# Patient Record
Sex: Female | Born: 1990 | Hispanic: Yes | Marital: Married
Health system: Midwestern US, Community
[De-identification: ages and names within clinical notes are randomized; demographics above are authoritative.]

## PROBLEM LIST (undated history)

## (undated) DIAGNOSIS — Z0000 Encounter for general adult medical examination without abnormal findings: Secondary | ICD-10-CM

## (undated) DIAGNOSIS — E282 Polycystic ovarian syndrome: Secondary | ICD-10-CM

## (undated) DIAGNOSIS — G43909 Migraine, unspecified, not intractable, without status migrainosus: Secondary | ICD-10-CM

## (undated) HISTORY — PX: EYE SURGERY: SHX253

---

## 2018-07-17 ENCOUNTER — Ambulatory Visit: Payer: No Typology Code available for payment source | Admitting: Family Medicine

## 2018-07-17 ENCOUNTER — Other Ambulatory Visit: Payer: Self-pay

## 2018-07-17 ENCOUNTER — Encounter: Payer: Self-pay | Admitting: Family Medicine

## 2018-07-17 VITALS — BP 122/76 | HR 73 | Temp 98.0°F | Resp 14 | Ht 66.0 in | Wt 157.2 lb

## 2018-07-17 DIAGNOSIS — N926 Irregular menstruation, unspecified: Secondary | ICD-10-CM

## 2018-07-17 DIAGNOSIS — Z3045 Encounter for surveillance of transdermal patch hormonal contraceptive device: Secondary | ICD-10-CM

## 2018-07-17 DIAGNOSIS — R1030 Lower abdominal pain, unspecified: Secondary | ICD-10-CM | POA: Diagnosis not present

## 2018-07-17 LAB — POCT URINE PREGNANCY: Preg Test, Ur: NEGATIVE

## 2018-07-17 MED ORDER — NORELGESTROMIN-ETH ESTRADIOL 150-35 MCG/24HR TD PTWK: 1.0000 | MEDICATED_PATCH | TRANSDERMAL | 5 refills | Status: DC

## 2018-07-17 MED ORDER — AMITRIPTYLINE HCL 25 MG PO TABS: 25.0000 mg | ORAL_TABLET | Freq: Every day | ORAL | 2 refills | Status: DC

## 2018-07-17 NOTE — Patient Instructions (Signed)
° ° ° °  If you have lab work done today you will be contacted with your lab results within the next 2 weeks.  If you have not heard from us then please contact us. The fastest way to get your results is to register for My Chart. ° ° °IF you received an x-ray today, you will receive an invoice from [facility]. Please contact [facility] at [phone] with questions or concerns regarding your invoice.  ° °IF you received labwork today, you will receive an invoice from [facility]. Please contact [facility] at [phone] with questions or concerns regarding your invoice.  ° °Our billing staff will not be able to assist you with questions regarding bills from these companies. ° °You will be contacted with the lab results as soon as they are available. The fastest way to get your results is to activate your My Chart account. Instructions are located on the last page of this paperwork. If you have not heard from us regarding the results in 2 weeks, please contact this office. °  ° ° ° °

## 2018-07-17 NOTE — Progress Notes (Signed)
2/8/20208:41 AM  Vicki Alvarez July 03, 1990, 29 y.o. female [id number]  Chief Complaint  Patient presents with  . Establish Care    just moved here and need to establish care, need a refill on birthcontrol patch (IC xulane 150/35) was put on [facility] due to very bad cramp, missed periods. Has been having periods since been on bc patch but still having cramping    HPI:   Patient is a 28 y.o. female who presents today to establish care. Patient requesting refill of her birth control.  Patient recently moved here from [place], looking for warmer weather Recently married this Nov Has h/o irregular periods and bad cramps, heavy but denies clots Reports normal [place] Was gaining weight and several BC Being on depo and COC still having pain Was placed on the patch, periods regular but still painful Cramping last entire menses which last about a week Putting pressure helps She denies any changes to bowels First severe abd pain started when she started having menstrual migraines A/w bloating and nausea G0 No interruption in [facility] Last pap 2019 - normal   Fall Risk  07/17/2018  Falls in the past year? 0  Number falls in past yr: 0  Injury with Fall? 0  Follow up Falls evaluation completed     Depression screen [facility] 2/9 07/17/2018  Decreased Interest 0  Down, Depressed, Hopeless 0  PHQ - 2 Score 0    No Known Allergies  Prior to Admission medications   Medication Sig Start Date End Date Taking? Authorizing Provider  ibuprofen (ADVIL,MOTRIN) 800 MG tablet Take 800 mg by mouth every 8 (eight) hours as needed.   Yes [provider]  norelgestromin-ethinyl estradiol [facility] Medico) 150-35 MCG/24HR transdermal patch Place 1 patch onto the skin once a week.   Yes [provider]    No past medical history on file.  History reviewed. No pertinent surgical history.  Social History   Tobacco Use  . Smoking status: Not on file  Substance Use Topics  . Alcohol use: Not on file    No  family history on file.  Review of Systems  Constitutional: Negative for chills, fever, malaise/fatigue and weight loss.  Respiratory: Negative for cough and shortness of breath.   Cardiovascular: Negative for chest pain, palpitations and leg swelling.  Gastrointestinal: Positive for abdominal pain and nausea. Negative for blood in stool, constipation, diarrhea (but patient reports  baseline BM about 3 x day), melena and vomiting.  Neurological: Positive for headaches. Negative for dizziness.     OBJECTIVE:  Blood pressure 122/76, pulse 73, temperature 98 F (36.7 C), temperature source Oral, resp. rate 14, height 5\' 6"  (1.676 m), weight 157 lb 3.2 oz (71.3 kg), SpO2 98 %. Body mass index is 25.37 kg/m.   Physical Exam Vitals signs and nursing note reviewed.  Constitutional:      Appearance: She is well-developed.  HENT:     Head: Normocephalic and atraumatic.     Mouth/Throat:     Pharynx: No oropharyngeal exudate.  Eyes:     General: No scleral icterus.    Conjunctiva/sclera: Conjunctivae normal.     Pupils: Pupils are equal, round, and reactive to light.  Neck:     Musculoskeletal: Neck supple.  Cardiovascular:     Rate and Rhythm: Normal rate and regular rhythm.     Heart sounds: Normal heart sounds. No murmur. No friction rub. No gallop.   Pulmonary:     Effort: Pulmonary effort is normal.  Breath sounds: Normal breath sounds. No wheezing or rales.  Abdominal:     General: Bowel sounds are normal.     Palpations: Abdomen is soft.     Tenderness: There is abdominal tenderness in the suprapubic area and left lower quadrant. There is rebound. There is no guarding.  Skin:    General: Skin is warm and dry.  Neurological:     Mental Status: She is alert and oriented to person, place, and time.     Results for orders placed or performed in visit on 07/17/18 (from the past 24 hour(s))  POCT urine pregnancy     Status: None   Collection Time: 07/17/18  8:49 AM    Result Value Ref Range   Preg Test, Ur Negative Negative    ASSESSMENT and PLAN  1. Encounter for surveillance of transdermal patch hormonal contraceptive device Stable. Will cont with current BC method - POCT urine pregnancy  2. Irregular periods - CBC - TSH  3. Lower abdominal pain ? abd migraines? Trial of TCA. Reviewed r/se/b   Other orders - ibuprofen (ADVIL,MOTRIN) 800 MG tablet; Take 800 mg by mouth every 8 (eight) hours as needed. - amitriptyline (ELAVIL) 25 MG tablet; Take 1 tablet (25 mg total) by mouth at bedtime. - norelgestromin-ethinyl estradiol [facility]ico(XULANE) 150-35 MCG/24HR transdermal patch; Place 1 patch onto the skin once a week.  Return in about 3 months (around 10/15/2018).    Myles LippsIrma M Santiago, MD [facility] [address] [place], [place] [address] Ph.  [phone] Fax [phone]

## 2018-07-18 LAB — CBC
Hematocrit: 40.2 % (ref 34.0–46.6)
Hemoglobin: 13.2 g/dL (ref 11.1–15.9)
MCH: 29.1 pg (ref 26.6–33.0)
MCHC: 32.8 g/dL (ref 31.5–35.7)
MCV: 89 fL (ref 79–97)
Platelets: 297 10*3/uL (ref 150–450)
RBC: 4.54 x10E6/uL (ref 3.77–5.28)
RDW: 12.2 % (ref 11.7–15.4)
WBC: 7.3 10*3/uL (ref 3.4–10.8)

## 2018-07-18 LAB — TSH: TSH: 0.717 u[IU]/mL (ref 0.450–4.500)

## 2018-07-22 ENCOUNTER — Encounter: Payer: Self-pay | Admitting: Family Medicine

## 2018-07-28 ENCOUNTER — Telehealth: Payer: Self-pay | Admitting: Family Medicine

## 2018-07-28 NOTE — Telephone Encounter (Signed)
Copied from CRM [id number]. Topic: Quick Communication - See Telephone Encounter >> Jul 28, 2018 11:41 AM Lorrine Kin, NT wrote: CRM for notification. See Telephone encounter for: 07/28/18.  See MyChart message from 07/22/2018.  Patient calling and states that the medication that was prescribed makes her nauseated and very slow. 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 Would like to know if there is an alternative?

## 2018-07-28 NOTE — Telephone Encounter (Signed)
Patient's concern/request has been addressed 

## 2018-07-28 NOTE — Telephone Encounter (Signed)
Spoke with pt about medication reaction and she states that since she started taking the Amitriptyline she has been having Nausea, Lightheaded, and Fatigue. Pt states she feels worst then before she started the medication. She would like to know is there any other medication she can try?

## 2018-10-20 ENCOUNTER — Ambulatory Visit: Payer: No Typology Code available for payment source | Admitting: Family Medicine

## 2018-11-08 DIAGNOSIS — Z20828 Contact with and (suspected) exposure to other viral communicable diseases: Secondary | ICD-10-CM | POA: Diagnosis not present

## 2018-12-04 DIAGNOSIS — Z20828 Contact with and (suspected) exposure to other viral communicable diseases: Secondary | ICD-10-CM | POA: Diagnosis not present

## 2018-12-06 DIAGNOSIS — J029 Acute pharyngitis, unspecified: Secondary | ICD-10-CM | POA: Diagnosis not present

## 2018-12-06 DIAGNOSIS — R51 Headache: Secondary | ICD-10-CM | POA: Diagnosis not present

## 2018-12-06 DIAGNOSIS — Z1159 Encounter for screening for other viral diseases: Secondary | ICD-10-CM | POA: Diagnosis not present

## 2018-12-18 DIAGNOSIS — R51 Headache: Secondary | ICD-10-CM | POA: Diagnosis not present

## 2018-12-18 DIAGNOSIS — Z1159 Encounter for screening for other viral diseases: Secondary | ICD-10-CM | POA: Diagnosis not present

## 2018-12-18 DIAGNOSIS — R110 Nausea: Secondary | ICD-10-CM | POA: Diagnosis not present

## 2019-01-17 LAB — HM PAP SMEAR: PAP Smear, External: NORMAL

## 2019-02-13 DIAGNOSIS — L308 Other specified dermatitis: Secondary | ICD-10-CM | POA: Diagnosis not present

## 2019-02-13 DIAGNOSIS — G43109 Migraine with aura, not intractable, without status migrainosus: Secondary | ICD-10-CM | POA: Diagnosis not present

## 2019-02-16 ENCOUNTER — Other Ambulatory Visit: Payer: Self-pay

## 2019-02-16 ENCOUNTER — Encounter (HOSPITAL_BASED_OUTPATIENT_CLINIC_OR_DEPARTMENT_OTHER): Payer: Self-pay | Admitting: *Deleted

## 2019-02-16 ENCOUNTER — Emergency Department (HOSPITAL_BASED_OUTPATIENT_CLINIC_OR_DEPARTMENT_OTHER): Payer: BC Managed Care – PPO

## 2019-02-16 ENCOUNTER — Emergency Department (HOSPITAL_BASED_OUTPATIENT_CLINIC_OR_DEPARTMENT_OTHER)
Admission: EM | Admit: 2019-02-16 | Discharge: 2019-02-16 | Disposition: A | Discharge status: Home / Self Care | Payer: BC Managed Care – PPO | Attending: Emergency Medicine | Admitting: Emergency Medicine

## 2019-02-16 DIAGNOSIS — G43809 Other migraine, not intractable, without status migrainosus: Secondary | ICD-10-CM | POA: Insufficient documentation

## 2019-02-16 DIAGNOSIS — L300 Nummular dermatitis: Secondary | ICD-10-CM | POA: Diagnosis not present

## 2019-02-16 DIAGNOSIS — R51 Headache: Secondary | ICD-10-CM | POA: Diagnosis not present

## 2019-02-16 HISTORY — DX: Migraine, unspecified, not intractable, without status migrainosus: G43.909

## 2019-02-16 LAB — PREGNANCY, URINE: Preg Test, Ur: NEGATIVE

## 2019-02-16 MED ORDER — PROCHLORPERAZINE MALEATE 10 MG PO TABS
10.0000 mg | ORAL_TABLET | Freq: Once | ORAL | Status: AC
  Administered 2019-02-16: 20:00:00 10 mg via ORAL
  Filled 2019-02-16: qty 1

## 2019-02-16 MED ORDER — ONDANSETRON 4 MG PO TBDP
4.0000 mg | ORAL_TABLET | Freq: Once | ORAL | Status: AC
  Administered 2019-02-16: 21:00:00 4 mg via ORAL
  Filled 2019-02-16: qty 1

## 2019-02-16 MED ORDER — DIPHENHYDRAMINE HCL 25 MG PO CAPS
25.0000 mg | ORAL_CAPSULE | Freq: Once | ORAL | Status: AC
  Administered 2019-02-16: 20:00:00 25 mg via ORAL
  Filled 2019-02-16: qty 1

## 2019-02-16 NOTE — ED Provider Notes (Signed)
[facility] EMERGENCY DEPARTMENT Provider Note   CSN: [id number] Arrival date & time: 02/16/19  1920     History   Chief Complaint Chief Complaint  Patient presents with  . Headache    HPI Vicki Alvarez is a 28 y.o. female.     The history is provided by the patient.  Migraine This is a new problem. The current episode started more than 2 days ago. The problem occurs every several days. The problem has not changed since onset.Associated symptoms include headaches. Pertinent negatives include no chest pain, no abdominal pain and no shortness of breath. Nothing aggravates the symptoms. Nothing relieves the symptoms. She has tried acetaminophen for the symptoms. The treatment provided mild relief.    Past Medical History:  Diagnosis Date  . Migraine     There are no active problems to display for this patient.   Past Surgical History:  Procedure Laterality Date  . EYE SURGERY       OB History   No obstetric history on file.      Home Medications    Prior to Admission medications   Medication Sig Start Date End Date Taking? Authorizing Provider  ibuprofen (ADVIL,MOTRIN) 800 MG tablet Take 800 mg by mouth every 8 (eight) hours as needed.    [provider]  norelgestromin-ethinyl estradiol Burr [facility]ico) 150-35 MCG/24HR transdermal patch Place 1 patch onto the skin once a week. 07/17/18   Myles Lipps, MD    Family History No family history on file.  Social History Social History   Tobacco Use  . Smoking status: Never Smoker  . Smokeless tobacco: Never Used  Substance Use Topics  . Alcohol use: Not Currently  . Drug use: Not Currently     Allergies   Patient has no known allergies.   Review of Systems Review of Systems  Constitutional: Negative for chills and fever.  HENT: Negative for ear pain and sore throat.   Eyes: Positive for photophobia. Negative for pain, discharge, redness, itching and visual disturbance.  Respiratory:  Negative for cough and shortness of breath.   Cardiovascular: Negative for chest pain and palpitations.  Gastrointestinal: Negative for abdominal pain and vomiting.  Genitourinary: Negative for dysuria and hematuria.  Musculoskeletal: Negative for arthralgias and back pain.  Skin: Negative for color change and rash.  Neurological: Positive for headaches. Negative for dizziness, tremors, seizures, facial asymmetry, speech difficulty, light-headedness and numbness.  All other systems reviewed and are negative.    Physical Exam Updated Vital Signs BP (!) 146/86 Comment: Simultaneous filing. User may not have seen previous data.  Pulse 72 Comment: Simultaneous filing. User may not have seen previous data.  Temp 98.9 F (37.2 C)   Resp 18   Ht 5\' 6"  (1.676 m)   Wt 70.3 kg   LMP 02/01/2019   SpO2 100% Comment: Simultaneous filing. User may not have seen previous data.  BMI 25.02 kg/m   Physical Exam Vitals signs and nursing note reviewed.  Constitutional:      General: She is not in acute distress.    Appearance: She is well-developed.  HENT:     Head: Normocephalic and atraumatic.  Eyes:     General: No visual field deficit.    Extraocular Movements: Extraocular movements intact.     Right eye: Normal extraocular motion and no nystagmus.     Left eye: Normal extraocular motion and no nystagmus.     Conjunctiva/sclera: Conjunctivae normal.     Pupils: Pupils  are equal, round, and reactive to light.  Neck:     Musculoskeletal: Normal range of motion and neck supple.  Cardiovascular:     Rate and Rhythm: Normal rate and regular rhythm.     Heart sounds: Normal heart sounds. No murmur.  Pulmonary:     Effort: Pulmonary effort is normal. No respiratory distress.     Breath sounds: Normal breath sounds.  Abdominal:     Palpations: Abdomen is soft.     Tenderness: There is no abdominal tenderness.  Musculoskeletal: Normal range of motion.  Skin:    General: Skin is warm and  dry.  Neurological:     Mental Status: She is alert and oriented to person, place, and time.     Cranial Nerves: No cranial nerve deficit, dysarthria or facial asymmetry.     Sensory: No sensory deficit.     Motor: No weakness.     Coordination: Coordination normal.     Gait: Gait normal.     Comments: 5+ out of 5 strength throughout, normal sensation, normal finger-to-nose finger, no drift  Psychiatric:        Mood and Affect: Mood normal.      ED Treatments / Results  Labs (all labs ordered are listed, but only abnormal results are displayed) Labs Reviewed  PREGNANCY, URINE    EKG None  Radiology Ct Head Wo Contrast  Result Date: 02/16/2019 CLINICAL DATA:  Headaches. EXAM: CT HEAD WITHOUT CONTRAST TECHNIQUE: Contiguous axial images were obtained from the base of the skull through the vertex without intravenous contrast. COMPARISON:  None. FINDINGS: Brain: No evidence of acute infarction, hemorrhage, hydrocephalus, extra-axial collection or mass lesion/mass effect. Vascular: No hyperdense vessel or unexpected calcification. Skull: Normal. Negative for fracture or focal lesion. Sinuses/Orbits: No acute finding. Other: None. IMPRESSION: 1. Normal brain. Electronically Signed   By: Kerby Moors M.D.   On: 02/16/2019 20:06    Procedures Procedures (including critical care time)  Medications Ordered in ED Medications  prochlorperazine (COMPAZINE) tablet 10 mg (10 mg Oral Given 02/16/19 1955)  diphenhydrAMINE (BENADRYL) capsule 25 mg (25 mg Oral Given 02/16/19 1955)     Initial Impression / Assessment and Plan / ED Course  I have reviewed the triage vital signs and the nursing notes.  Pertinent labs & imaging results that were available during my care of the patient were reviewed by me and considered in my medical decision making (see chart for details).     Vicki Alvarez is a 28 year old female who presents to the ED with headache.  Patient with history of migraines.   Unremarkable vitals.  No fever.  No signs to suggest meningitis.  Neurologically patient has normal exam.  She has had ongoing headache on and off for the last 2 weeks.  Over-the-counter medications have helped with some relief.  She is concerned about possible intracranial issue.  CT scan was performed after shared decision that was unremarkable.  Patient does not have any strokelike symptoms on exam.  Had relief with headache cocktail.  Overall suspect likely migraine.  Given education and reassurance and discharged from ED in good condition.  Recommend follow-up with neurology.  This chart was dictated using voice recognition software.  Despite best efforts to proofread,  errors can occur which can change the documentation meaning.    Final Clinical Impressions(s) / ED Diagnoses   Final diagnoses:  Other migraine without status migrainosus, not intractable    ED Discharge Orders    None  Virgina NorfolkCuratolo, Marai Teehan, DO 02/16/19 2041

## 2019-02-16 NOTE — ED Notes (Signed)
ED Provider at bedside. 

## 2019-02-16 NOTE — ED Triage Notes (Signed)
C/o ha/ x 2 weeks , no relief with motrin , seen by UC x 5 days ago no relief with tramadol

## 2019-03-15 DIAGNOSIS — R079 Chest pain, unspecified: Secondary | ICD-10-CM | POA: Diagnosis not present

## 2019-03-15 DIAGNOSIS — R0789 Other chest pain: Secondary | ICD-10-CM | POA: Diagnosis not present

## 2019-03-15 DIAGNOSIS — R5383 Other fatigue: Secondary | ICD-10-CM | POA: Diagnosis not present

## 2019-03-15 DIAGNOSIS — F419 Anxiety disorder, unspecified: Secondary | ICD-10-CM | POA: Diagnosis not present

## 2019-03-18 DIAGNOSIS — H5316 Psychophysical visual disturbances: Secondary | ICD-10-CM | POA: Diagnosis not present

## 2019-03-24 DIAGNOSIS — R002 Palpitations: Secondary | ICD-10-CM | POA: Diagnosis not present

## 2019-03-24 DIAGNOSIS — R110 Nausea: Secondary | ICD-10-CM | POA: Diagnosis not present

## 2019-03-24 DIAGNOSIS — R221 Localized swelling, mass and lump, neck: Secondary | ICD-10-CM | POA: Diagnosis not present

## 2019-03-24 DIAGNOSIS — R42 Dizziness and giddiness: Secondary | ICD-10-CM | POA: Diagnosis not present

## 2019-03-29 ENCOUNTER — Ambulatory Visit (INDEPENDENT_AMBULATORY_CARE_PROVIDER_SITE_OTHER): Payer: BC Managed Care – PPO | Admitting: Cardiology

## 2019-03-29 ENCOUNTER — Other Ambulatory Visit: Payer: Self-pay

## 2019-03-29 ENCOUNTER — Encounter: Payer: Self-pay | Admitting: Cardiology

## 2019-03-29 VITALS — BP 131/82 | HR 78 | Ht 66.0 in | Wt 161.0 lb

## 2019-03-29 DIAGNOSIS — Z7189 Other specified counseling: Secondary | ICD-10-CM | POA: Diagnosis not present

## 2019-03-29 DIAGNOSIS — R42 Dizziness and giddiness: Secondary | ICD-10-CM | POA: Insufficient documentation

## 2019-03-29 DIAGNOSIS — R110 Nausea: Secondary | ICD-10-CM | POA: Diagnosis not present

## 2019-03-29 DIAGNOSIS — Z8619 Personal history of other infectious and parasitic diseases: Secondary | ICD-10-CM

## 2019-03-29 DIAGNOSIS — R002 Palpitations: Secondary | ICD-10-CM | POA: Diagnosis not present

## 2019-03-29 DIAGNOSIS — Z8616 Personal history of COVID-19: Secondary | ICD-10-CM | POA: Insufficient documentation

## 2019-03-29 NOTE — Patient Instructions (Addendum)
Medication Instructions:  Your Physician recommend you continue on your current medication as directed.    *If you need a refill on your cardiac medications before your next appointment, please call your pharmacy*  Lab Work: None  Testing/Procedures: Your physician has requested that you have an echocardiogram. Echocardiography is a painless test that uses sound waves to create images of your heart. It provides your doctor with information about the size and shape of your heart and how well your heart's chambers and valves are working. This procedure takes approximately one hour. There are no restrictions for this procedure. [place] [address]  Our physician has recommended that you wear an 14  DAY ZIO-PATCH monitor. The Zio patch cardiac monitor continuously records heart rhythm data for up to 14 days, this is for patients being evaluated for multiple types heart rhythms. For the first 24 hours post application, please avoid getting the Zio monitor wet in the shower or by excessive sweating during exercise. After that, feel free to carry on with regular activities. Keep soaps and lotions away from the ZIO XT Patch.   Someone will call you to have monitor mailed.   Follow-Up: At [facility], you and your health needs are our priority.  As part of our continuing mission to provide you with exceptional heart care, we have created designated Provider Care Teams.  These Care Teams include your primary Cardiologist (physician) and Advanced Practice Providers (APPs -  Physician Assistants and Nurse Practitioners) who all work together to provide you with the care you need, when you need it.  Your next appointment:   12 months  The format for your next appointment:   In Person  Provider:   Buford Dresser, MD

## 2019-03-29 NOTE — Progress Notes (Signed)
Cardiology Office Note:    Date:  03/29/2019   ID:  Vicki Alvarez, DOB 1991/01/17, MRN [id number]  PCP:  Jenny Reichmann, PA-C  Cardiologist:  Buford Dresser, MD  Referring MD: Darra Lis*   CC: new patient consult for palpitations  History of Present Illness:    Vicki Alvarez is a 28 y.o. female with a hx of recent Covid infection (since tested negative) who is seen as a new consult at the request of Livengood, Jessica J, P* for the evaluation and management of palpitations.  Patient concerns today: Since she and her husband got Covid in July, she has not felt back to baseline. Has intermittent nausea, palpitations, lightheadedness, shortness of breath. Has been to urgent care/ER, was told everything was normal.   Tachycardia/palpitations: -Initial onset: since Covid in July -Frequency/Duration: feels it is completely random, comes and goes. Happening about once/week right now. Not associated with activity, has noticed at rest and at night. Apple watch has noted pulse to 120-130 bpm at rest.  -Associated symptoms: lightheadedness, nausea, elevated BP -Aggravating/alleviating factors: no clear -Syncope/near syncope: no syncope, does have lightheadedness -Prior cardiac history: none -Prior workup: none -Prior treatment: -Possible medication interactions: none -Caffeine: none, only drinks water -Alcohol: none -Tobacco: none -OTC supplements: none -Comorbidities: none -Exercise level: hasn't worked out since [employer], able to climb stairs, do ADLs, etc. -Labs: TSH, kidney function/electrolytes, CBC reviewed from 03/[facility]/19 [facility] visit, all normal -Cardiac ROS: no chest pain, no shortness of breath, no PND, no orthopnea, no LE edema. -Family history: mom had a test done for her heart, not sure what/why. No history of MI/CVA that she knows of. No heart failure, no SCD.   Denies chest pain, shortness of breath at rest or with normal exertion. No PND,  orthopnea, LE edema or unexpected weight gain. No syncope.  Has had a lot of stress, married in 04/2018, moved from [place], has had to move between 3 different apartments, then got Covid.   Past Medical History:  Diagnosis Date  . Migraine     Past Surgical History:  Procedure Laterality Date  . EYE SURGERY      Current Medications: Current Outpatient Medications on File Prior to Visit  Medication Sig  . ibuprofen (ADVIL,MOTRIN) 800 MG tablet Take 800 mg by mouth every 8 (eight) hours as needed.  . norelgestromin-ethinyl estradiol Marilu Favre) 150-35 MCG/24HR transdermal patch Place 1 patch onto the skin once a week.   No current facility-administered medications on file prior to visit.      Allergies:   Patient has no known allergies.   Social History   Tobacco Use  . Smoking status: Never Smoker  . Smokeless tobacco: Never Used  Substance Use Topics  . Alcohol use: Not Currently  . Drug use: Not Currently    Family History: mom had a test done for her heart, not sure what/why. No history of MI/CVA that she knows of. No heart failure, no SCD.   ROS:   Please see the history of present illness.  Additional pertinent ROS: Constitutional: Negative for chills, fever, night sweats, unintentional weight loss  HENT: Negative for ear pain and hearing loss.   Eyes: Negative for loss of vision and eye pain.  Respiratory: Negative for cough, sputum, wheezing.   Cardiovascular: See HPI. Gastrointestinal: Negative for abdominal pain, melena, and hematochezia.  Genitourinary: Negative for dysuria and hematuria.  Musculoskeletal: Negative for falls and myalgias.  Skin: Negative for itching and rash.  Neurological: Negative  for focal weakness, focal sensory changes and loss of consciousness.  Endo/Heme/Allergies: Does not bruise/bleed easily.     EKGs/Labs/Other Studies Reviewed:    The following studies were reviewed today: Notes and labs from PCP ER visit 02/16/19  EKG:   EKG is personally reviewed.  The ekg ordered today demonstrates SR with sinus arrhythmia  Recent Labs: 07/17/2018: Hemoglobin 13.2; Platelets 297; TSH 0.717  Recent Lipid Panel No results found for: CHOL, TRIG, HDL, CHOLHDL, VLDL, LDLCALC, LDLDIRECT  Physical Exam:    VS:  BP 131/82   Pulse 78   Ht 5\' 6"  (1.676 m)   Wt 161 lb (73 kg)   SpO2 100%   BMI 25.99 kg/m    Orthostatic VS for the past 24 hrs (Last 3 readings):  BP- Lying Pulse- Lying BP- Sitting Pulse- Sitting BP- Standing at 0 minutes Pulse- Standing at 0 minutes BP- Standing at 3 minutes Pulse- Standing at 3 minutes  03/29/19 0900 123/78 80 124/82 81 126/82 96 133/80 102    Wt Readings from Last 3 Encounters:  03/29/19 161 lb (73 kg)  02/16/19 155 lb (70.3 kg)  07/17/18 157 lb 3.2 oz (71.3 kg)    GEN: Well nourished, well developed in no acute distress HEENT: Normal, moist mucous membranes NECK: No JVD CARDIAC: regular rhythm, normal S1 and S2, no murmurs, rubs, gallops.  VASCULAR: Radial and DP pulses 2+ bilaterally. No carotid bruits RESPIRATORY:  Clear to auscultation without rales, wheezing or rhonchi  ABDOMEN: Soft, non-tender, non-distended MUSCULOSKELETAL:  Ambulates independently SKIN: Warm and dry, no edema NEUROLOGIC:  Alert and oriented x 3. No focal neuro deficits noted. PSYCHIATRIC:  Normal affect    ASSESSMENT:    1. Palpitations   2. Episodic lightheadedness   3. History of 2019 novel coronavirus disease (COVID-19)   4. Cardiac risk counseling   5. Counseling on health promotion and disease prevention    PLAN:    Palpitations, episodic lightheadedness, post Covid -will order event monitor to determine if this is sinus vs. Arrhythmia. Instructed on monitor use -echocardiogram given persistent symptoms -BMET, CBC, TSH all WNL at PCP -does not meet orthostatic criteria today. We did discuss that there is some evidence for dysautonomia post covid, but this is an 2020 of research.   Cardiac risk counseling and prevention recommendations: -recommend heart healthy/Mediterranean diet, with whole grains, fruits, vegetable, fish, lean meats, nuts, and olive oil. Limit salt. -recommend moderate walking, 3-5 times/week for 30-50 minutes each session. Aim for at least 150 minutes.week. Goal should be pace of 3 miles/hours, or walking 1.5 miles in 30 minutes -recommend avoidance of tobacco products. Avoid excess alcohol. -ASCVD risk score: The ASCVD Risk score Secretary/administrator DC Jr., et al., 2013) failed to calculate for the following reasons:   The 2013 ASCVD risk score is only valid for ages 66 to 33    Plan for follow up: if testing unremarkable, follow up in 1 year. If there are abnormalities, we will see her back sooner.  Medication Adjustments/Labs and Tests Ordered: Current medicines are reviewed at length with the patient today.  Concerns regarding medicines are outlined above.  Orders Placed This Encounter  Procedures  . LONG TERM MONITOR (3-14 DAYS)  . EKG 12-Lead  . ECHOCARDIOGRAM COMPLETE   No orders of the defined types were placed in this encounter.   Patient Instructions  Medication Instructions:  Your Physician recommend you continue on your current medication as directed.    *If you need a refill on  your cardiac medications before your next appointment, please call your pharmacy*  Lab Work: None  Testing/Procedures: Your physician has requested that you have an echocardiogram. Echocardiography is a painless test that uses sound waves to create images of your heart. It provides your doctor with information about the size and shape of your heart and how well your heart's chambers and valves are working. This procedure takes approximately one hour. There are no restrictions for this procedure. [address]  Our physician has recommended that you wear an 14  DAY ZIO-PATCH monitor. The Zio patch cardiac monitor continuously records heart rhythm  data for up to 14 days, this is for patients being evaluated for multiple types heart rhythms. For the first 24 hours post application, please avoid getting the Zio monitor wet in the shower or by excessive sweating during exercise. After that, feel free to carry on with regular activities. Keep soaps and lotions away from the ZIO XT Patch.   Someone will call you to have monitor mailed.   Follow-Up: At [facility], you and your health needs are our priority.  As part of our continuing mission to provide you with exceptional heart care, we have created designated Provider Care Teams.  These Care Teams include your primary Cardiologist (physician) and Advanced Practice Providers (APPs -  Physician Assistants and Nurse Practitioners) who all work together to provide you with the care you need, when you need it.  Your next appointment:   12 months  The format for your next appointment:   In Person  Provider:   Jodelle RedBridgette Bryella Diviney, MD     Signed, Jodelle RedBridgette Ryian Lynde, MD PhD 03/29/2019 11:04 AM    [facility]

## 2019-03-31 ENCOUNTER — Encounter: Payer: Self-pay | Admitting: Cardiology

## 2019-04-01 DIAGNOSIS — R110 Nausea: Secondary | ICD-10-CM | POA: Diagnosis not present

## 2019-04-01 DIAGNOSIS — R519 Headache, unspecified: Secondary | ICD-10-CM | POA: Diagnosis not present

## 2019-04-01 DIAGNOSIS — Z20828 Contact with and (suspected) exposure to other viral communicable diseases: Secondary | ICD-10-CM | POA: Diagnosis not present

## 2019-04-06 ENCOUNTER — Telehealth: Payer: Self-pay

## 2019-04-06 NOTE — Telephone Encounter (Signed)
Spoke to pt, went over monitor instructions. Verified address. 14 day ZIO ordered.  

## 2019-04-08 ENCOUNTER — Other Ambulatory Visit (HOSPITAL_COMMUNITY): Payer: BC Managed Care – PPO

## 2019-04-14 DIAGNOSIS — N946 Dysmenorrhea, unspecified: Secondary | ICD-10-CM | POA: Diagnosis not present

## 2019-04-14 DIAGNOSIS — R110 Nausea: Secondary | ICD-10-CM | POA: Diagnosis not present

## 2019-04-14 DIAGNOSIS — R221 Localized swelling, mass and lump, neck: Secondary | ICD-10-CM | POA: Diagnosis not present

## 2019-04-15 ENCOUNTER — Ambulatory Visit (INDEPENDENT_AMBULATORY_CARE_PROVIDER_SITE_OTHER): Payer: BC Managed Care – PPO

## 2019-04-15 DIAGNOSIS — R42 Dizziness and giddiness: Secondary | ICD-10-CM

## 2019-04-15 DIAGNOSIS — R002 Palpitations: Secondary | ICD-10-CM | POA: Diagnosis not present

## 2019-04-21 ENCOUNTER — Other Ambulatory Visit: Payer: Self-pay | Admitting: Family Medicine

## 2019-04-27 ENCOUNTER — Telehealth (HOSPITAL_COMMUNITY): Payer: Self-pay

## 2019-04-27 DIAGNOSIS — Z8619 Personal history of other infectious and parasitic diseases: Secondary | ICD-10-CM | POA: Diagnosis not present

## 2019-04-27 DIAGNOSIS — Z3009 Encounter for other general counseling and advice on contraception: Secondary | ICD-10-CM | POA: Diagnosis not present

## 2019-04-27 DIAGNOSIS — R102 Pelvic and perineal pain: Secondary | ICD-10-CM | POA: Diagnosis not present

## 2019-04-27 DIAGNOSIS — Z113 Encounter for screening for infections with a predominantly sexual mode of transmission: Secondary | ICD-10-CM | POA: Diagnosis not present

## 2019-04-27 NOTE — Telephone Encounter (Signed)
New message   Just an FYI. We have made several attempts to contact this patient including sending a letter to schedule or reschedule their echocardiogram. We will be removing the patient from the echo WQ.   11.16.20 @ 12: 36pm lm on home vm - Odette Watanabe   11.9.20 @ 3:29pm lm on home vm  - Hai Grabe  10.30.20 Cancel Rsn: Patient (patient has to get tested for covid )

## 2019-05-24 ENCOUNTER — Telehealth: Payer: Self-pay | Admitting: Cardiology

## 2019-05-24 NOTE — Telephone Encounter (Signed)
Patient returning Alisha's call in regards to monitor results.

## 2019-05-24 NOTE — Telephone Encounter (Signed)
Pt updated with monitor report and verbalized understanding.   

## 2019-05-27 DIAGNOSIS — Z30430 Encounter for insertion of intrauterine contraceptive device: Secondary | ICD-10-CM | POA: Diagnosis not present

## 2019-05-27 DIAGNOSIS — Z3202 Encounter for pregnancy test, result negative: Secondary | ICD-10-CM | POA: Diagnosis not present

## 2019-07-01 DIAGNOSIS — Z30431 Encounter for routine checking of intrauterine contraceptive device: Secondary | ICD-10-CM | POA: Diagnosis not present

## 2019-07-01 DIAGNOSIS — R102 Pelvic and perineal pain: Secondary | ICD-10-CM | POA: Diagnosis not present

## 2020-03-07 ENCOUNTER — Telehealth: Payer: Self-pay | Admitting: Cardiology

## 2020-03-07 NOTE — Telephone Encounter (Signed)
lvm for patient to return call to get follow up visit scheduled with Christopher from recall list 

## 2020-04-06 ENCOUNTER — Encounter: Payer: Self-pay | Admitting: Cardiology

## 2022-01-16 ENCOUNTER — Ambulatory Visit
Admit: 2022-01-16 | Discharge: 2022-01-17 | Payer: BLUE CROSS/BLUE SHIELD | Attending: Family Medicine | Primary: Family Medicine

## 2022-01-16 DIAGNOSIS — E282 Polycystic ovarian syndrome: Secondary | ICD-10-CM

## 2022-01-16 LAB — AMB POC URINE PREGNANCY TEST, VISUAL COLOR COMPARISON: HCG, Pregnancy, Urine, POC: NEGATIVE

## 2022-01-16 NOTE — Progress Notes (Signed)
Family Medicine Note   Patient Name: Tammy Oconnell   Date of Birth: 10/02/1990   Date: 01/16/2022  JOA:CZYSA K Magaret Justo, DO     History of Present Illness:     Chief Complaint   Patient presents with    New Patient     Make sure she is healthy and to have kids     There is no problem list on file for this patient.     Tammy Oconnell is a 31 y.o. female  who presents today to establish care.    Recently diagnosed with PCOS and endometriosis, was having painful heavy periods. In [place] got an IUD but this worsened her pain and had removed by GYN locally, [facility]. PCOS diagnosed on ultrasound.    Taking a PCOS supplement OTC. Notes her periods are usually irregular, last period was four months ago.  Will talk about metformin with GYN, she has had discussion previously.    Notes difficulty losing weight with PCOS. Asks for dietician referral.     Review of Systems    Review of systems is as indicated in HPI otherwise negative.    Objective:     PREVENTATIVE MEDICINE:  Pap smear- 2020 negative      Visit Vitals  BP 138/82 (Site: Left Upper Arm, Position: Sitting, Cuff Size: Medium Adult)   Pulse 69   Temp 98.5 F (36.9 C) (Oral)   Resp 15   Ht 5' 5.5" (1.664 m)   Wt 192 lb (87.1 kg)   SpO2 97%   BMI 31.46 kg/m      Physical Exam  Vitals and nursing note reviewed.   Constitutional:       General: She is not in acute distress.  HENT:      Head: Normocephalic and atraumatic.      Mouth/Throat:      Mouth: Mucous membranes are moist.   Eyes:      Extraocular Movements: Extraocular movements intact.   Cardiovascular:      Rate and Rhythm: Normal rate and regular rhythm.      Heart sounds: No murmur heard.  Pulmonary:      Effort: Pulmonary effort is normal. No respiratory distress.      Breath sounds: No wheezing or rales.   Musculoskeletal:         General: No deformity or signs of injury.      Right lower leg: No edema.      Left lower leg: No edema.   Skin:     General: Skin is warm and dry.    Neurological:      General: No focal deficit present.      Mental Status: She is alert and oriented to person, place, and time. Mental status is at baseline.      Gait: Gait normal.   Psychiatric:         Mood and Affect: Mood normal.         Behavior: Behavior normal.      PHQ-9  01/16/2022   Little interest or pleasure in doing things 0   Feeling down, depressed, or hopeless 0   PHQ-2 Score 0   PHQ-9 Total Score 0     MEDICATIONS:  Current Outpatient Medications on File Prior to Visit   Medication Sig Dispense Refill    Omega-3 Fatty Acids (FISH OIL) 1000 MG capsule Take by mouth daily      Inositol-D Chiro-Inositol (OVASITOL) 2000-50 MG PACK Take by mouth  vitamin B-12 (CYANOCOBALAMIN) 500 MCG tablet Take 1 tablet by mouth daily       No current facility-administered medications on file prior to visit.        No Known Allergies     VACCINES:  Immunization History   Administered Date(s) Administered    COVID-19, MODERNA Booster BLUE border, (age 18y+), IM, 32mcg/0.25mL 07/30/2019, 05/28/2020        HISTORY:  Past Medical History:   Diagnosis Date    PCOS (polycystic ovarian syndrome)       Past Surgical History:   Procedure Laterality Date    OTHER SURGICAL HISTORY      bone fusion on left hand and thumbs removed from both hands      Tobacco Use: Low Risk     Smoking Tobacco Use: Never    Smokeless Tobacco Use: Never    Passive Exposure: Not on file      LABS  Results for orders placed or performed in visit on 01/16/22   HM PAP SMEAR   Result Value Ref Range    PAP Smear, External normal      No results found for any previous visit.       Assessment/Plan:     1. Encounter for well adult exam without abnormal findings  - Comprehensive Metabolic Panel; Future  - CBC; Future  - Lipid Panel; Future  - TSH with Reflex; Future    2. PCOS (polycystic ovarian syndrome)  - [facility] OP Nutrition Services  - Hemoglobin A1C; Future    3. Pre-conception counseling  Start prenatal vitamin  Advised she discuss initiation of  metformin for PCOS, also offered to start rx today but she would like to try to manage without medication. Also discussed clomid for fertility in patients with PCOS.    4. Irregular periods  Pregnancy test negative today, will go several months without a period  - POC Urine Pregnancy Test (40102)     Orders Placed This Encounter    HM PAP SMEAR     This order was created through External Result Entry    Comprehensive Metabolic Panel     Standing Status:   Future     Standing Expiration Date:   01/17/2023    CBC     Standing Status:   Future     Standing Expiration Date:   01/17/2023    Lipid Panel     Standing Status:   Future     Standing Expiration Date:   01/17/2023    TSH with Reflex     Standing Status:   Future     Standing Expiration Date:   07/20/2023    Hemoglobin A1C     Standing Status:   Future     Standing Expiration Date:   01/17/2023    [facility] Nutrition Services     Referral Priority:   Routine     Referral Type:   Eval and Treat     Referral Reason:   Specialty Services Required     Requested Specialty:   Dietitian Registered     Number of Visits Requested:   1    POC Urine Pregnancy Test (72536)    Omega-3 Fatty Acids (FISH OIL) 1000 MG capsule     Sig: Take by mouth daily    Inositol-D Chiro-Inositol (OVASITOL) 2000-50 MG PACK     Sig: Take by mouth    vitamin B-12 (CYANOCOBALAMIN) 500 MCG tablet     Sig: Take 1 tablet  by mouth daily      Plan and treatment recommendations were discussed in depth with patient today who agrees to the plan . All questions were answered.        Follow up and Dispositions:   Return for fasting labs.         Corlis Leak, DO    Portions of the record may have been created with voice recognition software. Occasional wrong-word or 'sound-a-like' substitutions may have occurred due to the inherent limitations of voice recognition software. Read the chart carefully and recognize, using context, where substitutions have occurred.

## 2022-01-23 ENCOUNTER — Encounter

## 2022-01-23 ENCOUNTER — Encounter: Admit: 2022-01-23 | Discharge: 2022-01-23 | Payer: BLUE CROSS/BLUE SHIELD | Primary: Family Medicine

## 2022-01-23 DIAGNOSIS — Z0000 Encounter for general adult medical examination without abnormal findings: Secondary | ICD-10-CM

## 2022-01-23 LAB — COMPREHENSIVE METABOLIC PANEL
ALT: 15 U/L (ref 0–35)
AST: 21 U/L (ref 0–35)
Albumin/Globulin Ratio: 1.4 (ref 1.00–2.70)
Albumin: 4.4 g/dL (ref 3.5–5.2)
Alk Phosphatase: 60 U/L (ref 35–117)
Anion Gap: 13 mmol/L (ref 2–17)
BUN: 12 mg/dL (ref 6–20)
CO2: 21 mmol/L — ABNORMAL LOW (ref 22–29)
Calcium: 9.2 mg/dL (ref 8.6–10.0)
Chloride: 105 mmol/L (ref 98–107)
Creatinine: 0.8 mg/dL (ref 0.5–1.0)
Est, Glom Filt Rate: 102 mL/min/1.73m (ref >=60)
Globulin: 3.1 g/dL (ref 1.9–4.4)
Glucose: 87 mg/dL (ref 70–99)
OSMOLALITY CALCULATED: 277 mOsm/kg (ref 270–287)
Potassium: 4.1 mmol/L (ref 3.5–5.3)
Sodium: 139 mmol/L (ref 135–145)
Total Bilirubin: 0.58 mg/dL (ref 0.00–1.20)
Total Protein: 7.5 g/dL (ref 6.4–8.3)

## 2022-01-23 LAB — LIPID PANEL
Chol/HDL Ratio: 2.2 (ref 0.0–4.4)
Cholesterol: 140 mg/dL (ref 100–200)
HDL: 64 mg/dL (ref >=50)
LDL Cholesterol: 60.2 mg/dL (ref 0.0–100.0)
LDL/HDL Ratio: 0.9
Triglycerides: 79 mg/dL (ref 0–149)
VLDL: 15.8 mg/dL (ref 5.0–40.0)

## 2022-01-23 LAB — TSH WITH REFLEX: TSH: 1.14 mcIU/mL (ref 0.358–3.740)

## 2022-01-23 NOTE — Progress Notes (Signed)
verbal consent obtained, number of attempts:1 , site:top right hand, site cleansed with aseptic technique, 23G butterfly needle ,3 tubes collected: pressure applied to site with spot bandaid, patient tolerated procedure well. 2 SST:,1  LAV:,

## 2022-01-24 LAB — CBC
Hematocrit: 42.7 % (ref 34.0–47.0)
Hemoglobin: 14.1 g/dL (ref 11.5–15.7)
MCH: 28 pg (ref 27.0–34.5)
MCHC: 33 g/dL (ref 32.0–36.0)
MCV: 84.9 fL (ref 81.0–99.0)
MPV: 11.1 fL (ref 7.2–13.2)
NRBC Absolute: 0 10*3/uL (ref 0.000–0.012)
NRBC Automated: 0 % (ref 0.0–0.2)
Platelets: 285 10*3/uL (ref 140–440)
RBC: 5.03 x10e6/mcL (ref 3.60–5.20)
RDW: 13.8 % (ref 11.0–16.0)
WBC: 8.8 10*3/uL (ref 3.8–10.6)

## 2022-01-24 LAB — HEMOGLOBIN A1C
Est. Avg. Glucose, WB: 120
Est. Avg. Glucose-calculated: 129
Hemoglobin A1C: 5.8 % (ref 4.0–6.0)

## 2023-07-03 ENCOUNTER — Encounter: Payer: BLUE CROSS/BLUE SHIELD | Primary: Family Medicine

## 2023-07-07 ENCOUNTER — Encounter

## 2023-07-07 ENCOUNTER — Other Ambulatory Visit: Admit: 2023-07-07 | Discharge: 2023-07-07 | Payer: BLUE CROSS/BLUE SHIELD | Primary: Family Medicine

## 2023-07-07 DIAGNOSIS — Z0000 Encounter for general adult medical examination without abnormal findings: Secondary | ICD-10-CM

## 2023-07-08 LAB — TSH REFLEX TO FT4: TSH: 1.08 u[IU]/mL (ref 0.358–3.740)

## 2023-07-08 LAB — CBC WITH AUTO DIFFERENTIAL
Basophils %: 0.7 % (ref 0.0–2.0)
Basophils Absolute: 0.1 10*3/uL (ref 0.0–0.2)
Eosinophils %: 1.9 % (ref 0.0–7.0)
Eosinophils Absolute: 0.1 10*3/uL (ref 0.0–0.5)
Hematocrit: 42.4 % (ref 34.0–47.0)
Hemoglobin: 14.1 g/dL (ref 11.5–15.7)
Immature Grans (Abs): 0.02 10*3/uL (ref 0.00–0.06)
Immature Granulocytes %: 0.3 % (ref 0.0–0.6)
Lymphocytes Absolute: 2.9 10*3/uL (ref 1.0–3.2)
Lymphocytes: 41.8 % (ref 15.0–45.0)
MCH: 28.4 pg (ref 27.0–34.5)
MCHC: 33.3 g/dL (ref 30.0–36.0)
MCV: 85.3 fL (ref 81.0–99.0)
MPV: 10.7 fL (ref 7.0–12.2)
Monocytes %: 6.9 % (ref 4.0–12.0)
Monocytes Absolute: 0.5 10*3/uL (ref 0.3–1.0)
NRBC Absolute: 0 10*3/uL (ref 0.000–0.012)
NRBC Automated: 0 % (ref 0.0–0.2)
Neutrophils %: 48.4 % (ref 42.0–74.0)
Neutrophils Absolute: 3.3 10*3/uL (ref 1.6–7.3)
Platelets: 302 10*3/uL (ref 140–440)
RBC: 4.97 x10e6/mcL (ref 3.60–5.20)
RDW: 13.4 % (ref 10.0–17.0)
WBC: 6.9 10*3/uL (ref 3.8–10.6)

## 2023-07-08 LAB — COMPREHENSIVE METABOLIC PANEL
ALT: 28 U/L (ref 0–42)
AST: 25 U/L (ref 0–46)
Albumin/Globulin Ratio: 1.3 (ref 1.00–2.70)
Albumin: 4.5 g/dL (ref 3.5–5.2)
Alk Phosphatase: 67 U/L (ref 35–117)
Anion Gap: 13 mmol/L (ref 2–17)
BUN: 12 mg/dL (ref 6–20)
CO2: 24 mmol/L (ref 22–29)
Calcium: 9.4 mg/dL (ref 8.5–10.7)
Chloride: 104 mmol/L (ref 98–107)
Creatinine: 0.7 mg/dL (ref 0.5–1.0)
Est, Glom Filt Rate: 118 mL/min/1.73mÂ² (ref >=60)
Globulin: 3.4 g/dL (ref 1.9–4.4)
Glucose: 91 mg/dL (ref 70–99)
Osmolaliy Calculated: 280 mosm/kg (ref 270–287)
Potassium: 4.2 mmol/L (ref 3.5–5.3)
Sodium: 141 mmol/L (ref 135–145)
Total Bilirubin: 0.41 mg/dL (ref 0.00–1.20)
Total Protein: 7.9 g/dL (ref 5.7–8.3)

## 2023-07-08 LAB — HEMOGLOBIN A1C
Estimated Avg Glucose: 117
Estimated Avg Glucose: 126
Hemoglobin A1C: 5.7 % (ref 4.0–6.0)

## 2023-07-08 LAB — LIPID PANEL
Chol/HDL Ratio: 3 (ref 0.0–4.4)
Cholesterol, Total: 167 mg/dL (ref 100–200)
HDL: 56 mg/dL (ref >=50)
LDL Cholesterol: 90 mg/dL (ref 0.0–100.0)
LDL/HDL Ratio: 1.6
Triglycerides: 105 mg/dL (ref 0–149)
VLDL: 21 mg/dL (ref 5.0–40.0)

## 2023-07-09 ENCOUNTER — Ambulatory Visit
Admit: 2023-07-09 | Discharge: 2023-07-09 | Payer: BLUE CROSS/BLUE SHIELD | Attending: Family Medicine | Primary: Family Medicine

## 2023-07-09 VITALS — BP 124/82 | HR 77 | Resp 15 | Ht 65.5 in | Wt 195.0 lb

## 2023-07-09 DIAGNOSIS — Z0000 Encounter for general adult medical examination without abnormal findings: Secondary | ICD-10-CM

## 2023-07-09 NOTE — Progress Notes (Signed)
Family Medicine Note   Patient Name: Tammy Oconnell   Date of Birth: [birth year]   Date: 07/09/2023  ZOX:WRUE, Madilyn Fireman, DO     History of Present Illness:     Chief Complaint   Patient presents with    Annual Exam    Discuss Labs     Patient Active Problem List   Diagnosis    Family history of high blood pressure    PCOS (polycystic ovarian syndrome)    Prediabetes      [name] is a 33 y.o. female  who presents today for yearly preventative visit      Prediabetes-A1C is 5.7 on labs    PCOS and endometriosis- was having painful heavy periods. Had an IUD but this worsened her pain and had removed by GYN locally, [facility]. PCOS diagnosed on ultrasound she notes. Discussed metformin with GYN previously    Notes headaches more recently, no neurologic symptoms. Doesn't take anything for them. Pain along back of head.    Review of Systems    Review of systems is as indicated in HPI otherwise negative.    Objective:     PREVENTATIVE MEDICINE:  Pap smear- 2020 negative, [facility]    Visit Vitals  BP 124/82 (Site: Left Upper Arm, Position: Sitting, Cuff Size: Medium Adult)   Pulse 77   Resp 15   Ht 1.664 m (5' 5.5")   Wt 88.5 kg (195 lb)   SpO2 98%   BMI 31.96 kg/m      Physical Exam  Vitals and nursing note reviewed.   Constitutional:       General: She is not in acute distress.  HENT:      Head: Normocephalic and atraumatic.      Mouth/Throat:      Mouth: Mucous membranes are moist.   Eyes:      Extraocular Movements: Extraocular movements intact.   Cardiovascular:      Rate and Rhythm: Normal rate and regular rhythm.      Heart sounds: No murmur heard.  Pulmonary:      Effort: Pulmonary effort is normal. No respiratory distress.      Breath sounds: No wheezing or rales.   Musculoskeletal:         General: No deformity or signs of injury.      Right lower leg: No edema.      Left lower leg: No edema.   Skin:     General: Skin is warm and dry.   Neurological:      General: No focal  deficit present.      Mental Status: She is alert and oriented to person, place, and time. Mental status is at baseline.      Gait: Gait normal.   Psychiatric:         Mood and Affect: Mood normal.         Behavior: Behavior normal.            07/09/2023     2:54 PM   PHQ-9    Little interest or pleasure in doing things 0   Feeling down, depressed, or hopeless 0   PHQ-2 Score 0   PHQ-9 Total Score 0     MEDICATIONS:  Current Outpatient Medications on File Prior to Visit   Medication Sig Dispense Refill    Omega-3 Fatty Acids (FISH OIL) 1000 MG capsule Take by mouth daily      Inositol-D Chiro-Inositol (OVASITOL) 2000-50 MG PACK Take by  mouth      vitamin B-12 (CYANOCOBALAMIN) 500 MCG tablet Take 1 tablet by mouth daily       No current facility-administered medications on file prior to visit.        No Known Allergies     VACCINES:  Immunization History   Administered Date(s) Administered    COVID-19, MODERNA Booster BLUE border, (age 18y+), IM, 6mcg/0.25mL 07/30/2019, 05/28/2020        HISTORY:  Past Medical History:   Diagnosis Date    PCOS (polycystic ovarian syndrome)       Past Surgical History:   Procedure Laterality Date    OTHER SURGICAL HISTORY      bone fusion on left hand and thumbs removed from both hands      Tobacco Use: Low Risk  (07/09/2023)    Patient History     Smoking Tobacco Use: Never     Smokeless Tobacco Use: Never     Passive Exposure: Not on file      LABS  No results found for this visit on 07/09/23.    Orders Only on 07/07/2023   Component Date Value Ref Range Status    Hemoglobin A1C 07/07/2023 5.7  4.0 - 6.0 % Final    Comment: HEMOGLOBIN A1C INTERPRETATION:    The following arbitrary ranges may be used for interpretation of the results.  However, factors such as duration of diabetes, adherence to therapy, and  patient age should also be considered in assessing degree of blood glucose  control.    Hemoglobin A1C                 Avg. Blood  Sugar  --------------------------------------------------------------  6%                           135 mg/dL  7%                           170 mg/dL  8%                           205 mg/dL  9%                           240 mg/dL  13%                          275 mg/dL    ======================================================    A1C                      Glucose Control  ----------------------------------------------------------------  < 6.0 %                   Normal  6.0 - 6.9 %               Abnormal  7.0 - 7.9 %               Sub-Optimal Control  > 8.0 %                   Inadequate Control      Estimated Avg Glucose 07/07/2023 117   Final    Estimated Avg Glucose 07/07/2023 126   Final    Sodium 07/07/2023 141  135 - 145 mmol/L Final    Potassium 07/07/2023  4.2  3.5 - 5.3 mmol/L Final    Chloride 07/07/2023 104  98 - 107 mmol/L Final    CO2 07/07/2023 24  22 - 29 mmol/L Final    Glucose 07/07/2023 91  70 - 99 mg/dL Final    BUN 16/03/9603 12  6 - 20 mg/dL Final    Creatinine 54/02/8118 0.7  0.5 - 1.0 mg/dL Final    Anion Gap 14/78/2956 13  2 - 17 mmol/L Final    Osmolaliy Calculated 07/07/2023 280  270 - 287 mOsm/kg Final    Calcium 07/07/2023 9.4  8.5 - 10.7 mg/dL Final    Total Protein 07/07/2023 7.9  5.7 - 8.3 g/dL Final    Albumin 21/30/8657 4.5  3.5 - 5.2 g/dL Final    Globulin 84/69/6295 3.4  1.9 - 4.4 g/dL Final    Albumin/Globulin Ratio 07/07/2023 1.30  1.00 - 2.70 Final    Total Bilirubin 07/07/2023 0.41  0.00 - 1.20 mg/dL Final    Alk Phosphatase 07/07/2023 67  35 - 117 unit/L Final    AST 07/07/2023 25  0 - 46 unit/L Final    ALT 07/07/2023 28  0 - 42 unit/L Final    Est, Glom Filt Rate 07/07/2023 118  >=60 mL/min/1.15m Final    Comment: VERIFIED by Discern Expert.  GFR Interpretation:                                                                         % OF  KIDNEY  GFR                                                         STAGE  FUNCTION  ==================================================================================    > 90        Normal kidney function                       STAGE 1  90-100%  89 to 60      Mild loss of kidney function                 STAGE 2  80-60%  59 to 45      Mild to moderate loss of kidney function     STAGE 3a  59-45%  44 to 30      Moderate to severe loss of kidney function   STAGE 3b  44-30%  29 to 15      Severe loss of kidney function               STAGE 4  29-15%    < 15        Kidney failure                               STAGE 5  <15%  ==================================================================================  Modified from National Kidney Foundation    GFR Calculation performed using the CKD-EPI 2021 equation developed for use  with IDMS traceable creatinine methods and  is the calculation recommended by  the [facility] for estimating GFR in adults.      TSH 07/07/2023 1.080  0.358 - 3.740 mcIU/mL Final    Comment: TSH INTERPRETATION:    Controversy exists over an acceptable TSH range. Some experts argue that a  narrower reference range is better and will increase the detection of thyroid  disease, particularly marginal hypothyroidism.      Cholesterol, Total 07/07/2023 167  100 - 200 mg/dL Final    Comment: The National Cholesterol Education Program has published reference  cholesterol values for cardiovascular risk to be:    Less than 200 mg/dL     = Low Risk    829 to 239 mg/dL        = Borderline Risk    240mg /dL and greater    = High Risk      HDL 07/07/2023 56  >=50 mg/dL Final    Comment: The National Lipid Association and the [employer] Cholesterol Education Program  (NCEP) have set the guidelines for high-density lipoprotein (HDL) cholesterol  in adults ages 32 and up.      Triglycerides 07/07/2023 105  0 - 149 mg/dL Final    Comment:   TRIGLYCERIDE INTERPRETATION:                          Recommended Fasting Triglyc Levels for Adults                         =============================================                        Desirable                        < 150 mg/dL                        Average                          < 200 mg/dL                        Borderline High             200 to 500 mg/dL                        Hypertriglyceridemic             > 500 mg/dL                        =============================================      LDL Cholesterol 07/07/2023 90.0  0.0 - 100.0 mg/dL Final    LDL/HDL Ratio 07/07/2023 1.6   Final    Chol/HDL Ratio 07/07/2023 3.0  0.0 - 4.4 Final    VLDL 07/07/2023 21.0  5.0 - 40.0 mg/dL Final    WBC 56/21/3086 6.9  3.8 - 10.6 x10e3/mcL Final    RBC 07/07/2023 4.97  3.60 - 5.20 x10e6/mcL Final    Hemoglobin 07/07/2023 14.1  11.5 - 15.7 g/dL Final    Hematocrit 57/84/6962 42.4  34.0 - 47.0 % Final    MCV 07/07/2023 85.3  81.0 - 99.0 fL Final    MCH 07/07/2023 28.4  27.0 - 34.5 pg Final    MCHC 07/07/2023  33.3  30.0 - 36.0 g/dL Final    RDW 16/03/9603 13.4  10.0 - 17.0 % Final    Platelets 07/07/2023 302  140 - 440 x10e3/mcL Final    MPV 07/07/2023 10.7  7.0 - 12.2 fL Final    NRBC Automated 07/07/2023 0.0  0.0 - 0.2 % Final    NRBC Absolute 07/07/2023 0.000  0.000 - 0.012 x10e3/mcL Final    Neutrophils % 07/07/2023 48.4  42.0 - 74.0 % Final    Lymphocytes 07/07/2023 41.8  15.0 - 45.0 % Final    Monocytes % 07/07/2023 6.9  4.0 - 12.0 % Final    Eosinophils % 07/07/2023 1.9  0.0 - 7.0 % Final    Basophils % 07/07/2023 0.7  0.0 - 2.0 % Final    Neutrophils Absolute 07/07/2023 3.3  1.6 - 7.3 x10e3/mcL Final    Lymphocytes Absolute 07/07/2023 2.9  1.0 - 3.2 x10e3/mcL Final    Monocytes Absolute 07/07/2023 0.5  0.3 - 1.0 x10e3/mcL Final    Eosinophils Absolute 07/07/2023 0.1  0.0 - 0.5 x10e3/mcL Final    Basophils Absolute 07/07/2023 0.1  0.0 - 0.2 x10e3/mcL Final    Immature Granulocytes % 07/07/2023 0.3  0.0 - 0.6 % Final    Immature Grans (Abs) 07/07/2023 0.02  0.00 - 0.06 x10e3/mcL Final       Assessment/Plan:     1.  Encounter for well adult exam without abnormal findings  Labs reviewed with patient today  Discussed age appropriate screening tests and vaccines, updated planning  BMI reviewed   Depression screen reviewed and is negative unless notes above  Age appropriate anticipatory guidance discussed.  Follow up with GYN for pap smear  - CBC; Future  - Comprehensive Metabolic Panel; Future  - Lipid Panel; Future  - TSH reflex to FT4; Future    2. Prediabetes  Discussed option of metformin with PCOS as well  - Hemoglobin A1C; Future    3. Need for diphtheria-tetanus-pertussis (Tdap) vaccine  - Tdap, BOOSTRIX, (age 9 yrs+), IM    4. Nonintractable episodic headache, unspecified headache type  Use tylenol or ibuprofen prn. Call if symptoms worsen. No red flags symptoms         Orders Placed This Encounter    Tdap, BOOSTRIX, (age 11 yrs+), IM    CBC     Standing Status:   Future     Standing Expiration Date:   07/08/2024    Comprehensive Metabolic Panel     Standing Status:   Future     Standing Expiration Date:   07/08/2024    Lipid Panel     Standing Status:   Future     Standing Expiration Date:   07/08/2024    TSH reflex to FT4     Standing Status:   Future     Standing Expiration Date:   01/05/2025    Hemoglobin A1C     Standing Status:   Future     Standing Expiration Date:   07/08/2024      Plan and treatment recommendations were discussed in depth with patient today who agrees to the plan . All questions were answered.        Follow up and Dispositions:   Return in about 1 year (around 07/08/2024) for CPE with fasting labs prior.         Corlis Leak, DO    Portions of the record may have been created with voice recognition software. Occasional wrong-word or 'sound-a-like' substitutions  may have occurred due to the inherent limitations of voice recognition software. Read the chart carefully and recognize, using context, where substitutions have occurred.

## 2023-09-18 ENCOUNTER — Ambulatory Visit: Admit: 2023-09-18 | Discharge: 2023-09-18 | Payer: BLUE CROSS/BLUE SHIELD | Attending: Family | Primary: Family Medicine

## 2023-09-18 VITALS — BP 120/82 | HR 89 | Resp 16 | Ht 65.5 in | Wt 184.0 lb

## 2023-09-18 DIAGNOSIS — R051 Acute cough: Secondary | ICD-10-CM

## 2023-09-18 LAB — AMB POC COVID-19 & INFLUENZA A/B
INFLUENZA A RNA, POC: NOT DETECTED
INFLUENZA B RNA, POC: NOT DETECTED
SARS-COV-2 RNA, POC: NEGATIVE

## 2023-09-18 MED ORDER — TRIAMCINOLONE ACETONIDE 0.025 % EX OINT
0.025 | CUTANEOUS | 0 refills | Status: AC
Start: 2023-09-18 — End: 2023-09-25

## 2023-09-18 MED ORDER — FLUTICASONE PROPIONATE 50 MCG/ACT NA SUSP
50 | Freq: Every day | NASAL | 5 refills | 30.00000 days | Status: DC
Start: 2023-09-18 — End: 2024-07-08

## 2023-09-18 MED ORDER — FEXOFENADINE HCL 180 MG PO TABS
180 | ORAL_TABLET | Freq: Every day | ORAL | 0 refills | Status: AC
Start: 2023-09-18 — End: 2023-10-18

## 2023-09-18 MED ORDER — TRIAMCINOLONE ACETONIDE 40 MG/ML IJ SUSP
40 | Freq: Once | INTRAMUSCULAR | Status: AC
Start: 2023-09-18 — End: 2023-09-18
  Administered 2023-09-18: 20:00:00 40 mg via INTRAMUSCULAR

## 2023-09-18 NOTE — Progress Notes (Signed)
 Tammy Oconnell (DOB:  07/19/1990) is a 33 y.o. female, Established Patient, here for evaluation of the following chief complaint(s):  Other (Sunday started with sore throat, cough, congestion, fatigue. Denies Fever, no body aches. Notes a small rash on right arm in the antecubital area ), Cough, Congestion, Fatigue, and Rash (itches)      Vitals:    09/18/23 1441   BP: 120/82   BP Site: Left Upper Arm   Patient Position: Sitting   BP Cuff Size: Medium Adult   Pulse: 89   Resp: 16   SpO2: 97%   Weight: 83.5 kg (184 lb)   Height: 1.664 m (5\' 5.5")        Assessment & Plan   ASSESSMENT/PLAN:  1. Acute cough  -     AMB POC COVID-19 & Influenza A/B  -     triamcinolone acetonide (KENALOG-40) injection 40 mg; 40 mg, IntraMUSCular, ONCE, 1 dose, On Fri 09/18/23 at 1645      Notes:   Negative for COVID and influenza.    In-house injection given to help with cold-like symptoms along with rash.    Triamcinolone ointment prescribed to help with rash.    Symptoms resemble allergies.  Allegra along with Flonase prescribed to help with allergy-like symptoms.    Return if symptoms worsen or fail to improve.         Subjective   SUBJECTIVE/OBJECTIVE:  HPI  Patient presents today for fatigue, congestion and cough that started 3 days ago.  Denies fever or shortness of breath.    Also has a rash on her right arm in the antecubital area.  Denies any known allergies or trying any new fragrances.       Review of Systems     Objective   Physical Exam  Cardiovascular:      Rate and Rhythm: Normal rate and regular rhythm.   Pulmonary:      Effort: Pulmonary effort is normal.      Breath sounds: Normal breath sounds.   Skin:         Neurological:      Mental Status: She is alert.   Psychiatric:         Mood and Affect: Mood normal.         Behavior: Behavior normal.         Other objective data           Orders Only on 07/07/2023   Component Date Value Ref Range Status    Hemoglobin A1C 07/07/2023 5.7  4.0 - 6.0 % Final    Comment:  HEMOGLOBIN A1C INTERPRETATION:    The following arbitrary ranges may be used for interpretation of the results.  However, factors such as duration of diabetes, adherence to therapy, and  patient age should also be considered in assessing degree of blood glucose  control.    Hemoglobin A1C                 Avg. Blood Sugar  --------------------------------------------------------------  6%                           135 mg/dL  7%                           17 0 mg/dL  8%  205 mg/dL  9%                           240 mg/dL  16%                          275 mg/dL    ======================================================    A1C                      Glucose Control  ----------------------------------------------------------------  < 6.0 %                   Normal  6.0 - 6.9 %               Abnormal  7.0 - 7.9 %               Sub-Optimal Control  > 8.0 %                   Inadequate Control      Estimated Avg Glucose 07/07/2023 117   Final    Estimated Avg Glucose 07/07/2023 126   Final    Sodium 07/07/2023 141  135 - 145 mmol/L Final    Potassium 07/07/2023 4.2  3.5 - 5.3 mmol/L Final    Chloride 07/07/2023 104  98 - 107 mmol/L Final    CO2 07/07/2023 24  22 - 29 mmol/L Final    Glucose 07/07/2023 91  70 - 99 mg/dL Final    BUN 10/96/0454 12  6 - 20 mg/dL Final    Creatinine 09/81/1914 0.7  0.5 - 1.0 mg/dL Final    Anion Gap 78/29/5621 13  2 - 17 mmol/L Final    Osmolaliy Calculated 07/07/2023 280  270 - 287 mOsm/kg Final    Calcium 07/07/2023 9.4  8.5 - 10.7 mg/dL Final    Total Protein 07/07/2023 7.9  5.7 - 8.3 g/dL Final    Albumin 30/86/5784 4.5  3.5 - 5.2 g/dL Final    Globulin 69/62/9528 3.4  1.9 - 4.4 g/dL Final    Albumin/Globulin Ratio 07/07/2023 1.30  1.00 - 2.70 Final    Total Bilirubin 07/07/2023 0.41  0.00 - 1.20 mg/dL Final    Alk Phosphatase 07/07/2023 67  35 - 117 unit/L Final    AST 07/07/2023 25  0 - 46 unit/L Final    ALT 07/07/2023 28  0 - 42 unit/L Final    Est, Glom Filt Rate  07/07/2023 118  >=60 mL/min/1.59m Final    Comment: VERIFIED by Discern Expert.  GFR Interpretation:                                                                         % OF  KIDNEY  GFR                                                        STAGE  FUNCTION  ==================================================================================    > 90  Normal kidney function                       STAGE 1  90-100%  89 to 60      Mild loss of kidney function                 STAGE 2  80-60%  59 to 45      Mild to moderate loss of kidney function     STAGE 3a  59-45%  44 to 30      Moderate to severe loss of kidney function   STAGE 3b  44-30%  29 to 15      Severe loss of kidney function               STAGE 4  29-15%    < 15        Kidney failure                               STAGE 5  <15%  ==================================================================================  Modified from National Kidney Foundation    GFR Calculation performed using the CKD-EPI 2021 equation developed for use  with IDMS traceable creatinine methods and                            is the calculation recommended by  the [facility] for estimating GFR in adults.      TSH 07/07/2023 1.080  0.358 - 3.740 mcIU/mL Final    Comment: TSH INTERPRETATION:    Controversy exists over an acceptable TSH range. Some experts argue that a  narrower reference range is better and will increase the detection of thyroid  disease, particularly marginal hypothyroidism.      Cholesterol, Total 07/07/2023 167  100 - 200 mg/dL Final    Comment: The National Cholesterol Education Program has published reference  cholesterol values for cardiovascular risk to be:    Less than 200 mg/dL     = Low Risk    010 to 239 mg/dL        = Borderline Risk    240mg /dL and greater    = High Risk      HDL 07/07/2023 56  >=50 mg/dL Final    Comment: The National Lipid Association and the [employer] Cholesterol Education Program  (NCEP) have set the guidelines for  high-density lipoprotein (HDL) cholesterol  in adults ages 26 and up.      Triglycerides 07/07/2023 105  0 - 149 mg/dL Final    Comment:   TRIGLYCERIDE INTERPRETATION:                          Recommended Fasting Triglyc Levels for Adults                        =============================================                        Desirable                        < 150 mg/dL                        Average                          <  200 mg/dL                        Borderline High             200 to 500 mg/dL                        Hypertriglyceridemic             > 500 mg/dL                        =============================================      LDL Cholesterol 07/07/2023 90.0  0.0 - 100.0 mg/dL Final    LDL/HDL Ratio 07/07/2023 1.6   Final    Chol/HDL Ratio 07/07/2023 3.0  0.0 - 4.4 Final    VLDL 07/07/2023 21.0  5.0 - 40.0 mg/dL Final    WBC 09/81/1914 6.9  3.8 - 10.6 x10e3/mcL Final    RBC 07/07/2023 4.97  3.60 - 5.20 x10e6/mcL Final    Hemoglobin 07/07/2023 14.1  11.5 - 15.7 g/dL Final    Hematocrit 78/29/5621 42.4  34.0 - 47.0 % Final    MCV 07/07/2023 85.3  81.0 - 99.0 fL Final    MCH 07/07/2023 28.4  27.0 - 34.5 pg Final    MCHC 07/07/2023 33.3  30.0 - 36.0 g/dL Final    RDW 30/86/5784 13.4  10.0 - 17.0 % Final    Platelets 07/07/2023 302  140 - 440 x10e3/mcL Final    MPV 07/07/2023 10.7  7.0 - 12.2 fL Final    NRBC Automated 07/07/2023 0.0  0.0 - 0.2 % Final    NRBC Absolute 07/07/2023 0.000  0.000 - 0.012 x10e3/mcL Final    Neutrophils % 07/07/2023 48.4  42.0 - 74.0 % Final    Lymphocytes 07/07/2023 41.8  15.0 - 45.0 % Final    Monocytes % 07/07/2023 6.9  4.0 - 12.0 % Final    Eosinophils % 07/07/2023 1.9  0.0 - 7.0 % Final    Basophils % 07/07/2023 0.7  0.0 - 2.0 % Final    Neutrophils Absolute 07/07/2023 3.3  1.6 - 7.3 x10e3/mcL Final    Lymphocytes Absolute 07/07/2023 2.9  1.0 - 3.2 x10e3/mcL Final    Monocytes Absolute 07/07/2023 0.5  0.3 - 1.0 x10e3/mcL Final    Eosinophils Absolute 07/07/2023 0.1  0.0  - 0.5 x10e3/mcL Final    Basophils Absolute 07/07/2023 0.1  0.0 - 0.2 x10e3/mcL Final    Immature Granulocytes % 07/07/2023 0.3  0.0 - 0.6 % Final    Immature Grans (Abs) 07/07/2023 0.02  0.00 - 0.06 x10e3/mcL Final      Results for orders placed or performed in visit on 09/18/23   AMB POC COVID-19 & Influenza A/B   Result Value Ref Range    SARS-COV-2 RNA, POC Negative     INFLUENZA A RNA, POC Not-Detected     INFLUENZA B RNA, POC Not-Detected     Internal Control valid     Lot number swab      EXP date swab      LOT NUMBER POC      EXPIRATION DATE      Lot number solution      EXP date solution          No Known Allergies   Prior to Admission medications    Medication Sig Start Date End Date Taking? Authorizing Provider   fexofenadine  (ALLEGRA )  180 MG tablet Take 1 tablet by mouth daily 09/18/23 10/18/23 Yes Ary Rudnick, Murlean Armour, APRN - NP   fluticasone  (FLONASE ) 50 MCG/ACT nasal spray 2 sprays by Each Nostril route daily 09/18/23  Yes Emilio Harder, APRN - NP   Omega-3 Fatty Acids (FISH OIL) 1000 MG capsule Take by mouth daily   Yes [provider]   Inositol-D Chiro-Inositol (OVASITOL) 2000-50 MG PACK Take by mouth   Yes [provider]   vitamin B-12 (CYANOCOBALAMIN) 500 MCG tablet Take 1 tablet by mouth daily   Yes [provider]      Family History   Problem Relation Age of Onset    High Blood Pressure Mother     Diabetes Father     High Cholesterol Father     Obesity Sister       Social History     Socioeconomic History    Marital status: Married     Spouse name: Not on file    Number of children: Not on file    Years of education: Not on file    Highest education level: Not on file   Occupational History    Not on file   Tobacco Use    Smoking status: Never    Smokeless tobacco: Never   Vaping Use    Vaping status: Never Used   Substance and Sexual Activity    Alcohol use: Never    Drug use: Never    Sexual activity: Yes     Partners: Male   Other Topics Concern    Not on  file   Social History Narrative    Not on file     Social Drivers of Health     Financial Resource Strain: Not on file   Food Insecurity: Not on file   Transportation Needs: Not on file   Physical Activity: Not on file   Stress: Not on file   Social Connections: Unknown (10/22/2021)    Received from [facility]    Social Network     Social Network: Not on file   Intimate Partner Violence: Unknown (09/13/2021)    Received from [facility]    HITS     Physically Hurt: Not on file     Insult or Talk Down To: Not on file     Threaten Physical Harm: Not on file     Scream or Curse: Not on file   Housing Stability: Not on file      Past Surgical History:   Procedure Laterality Date    OTHER SURGICAL HISTORY      bone fusion on left hand and thumbs removed from both hands      Past Medical History:   Diagnosis Date    PCOS (polycystic ovarian syndrome)           On this date 09/18/2023 I have spent 20 minutes reviewing previous notes, test results and face to face with the patient discussing the diagnosis and importance of compliance with the treatment plan as well as documenting on the day of the visit.      An electronic signature was used to authenticate this note.    --Emilio Harder, APRN - NP

## 2024-07-05 ENCOUNTER — Other Ambulatory Visit: Admit: 2024-07-05 | Discharge: 2024-07-05 | Payer: BLUE CROSS/BLUE SHIELD | Primary: Family Medicine

## 2024-07-05 DIAGNOSIS — R7303 Prediabetes: Principal | ICD-10-CM

## 2024-07-05 NOTE — Progress Notes (Signed)
 23/RH  Venipuncture  Sight cleaned with alcohol wipe  Number of attempts: 1  Needle: 23  Site: RIGHT HAND  Tubes: 2SST/1LAV  Pressure applied to sight w/GAUZE  Patient tolerated procedure

## 2024-07-06 LAB — LIPID PANEL
Chol/HDL Ratio: 2.2 (ref 0.0–4.4)
Cholesterol, Total: 129 mg/dL (ref 100–200)
HDL: 58 mg/dL (ref >=50)
LDL Cholesterol: 57.4 mg/dL (ref 0.0–100.0)
LDL/HDL Ratio: 1
Triglycerides: 68 mg/dL (ref 0–149)
VLDL: 13.6 mg/dL (ref 5.0–40.0)

## 2024-07-06 LAB — COMPREHENSIVE METABOLIC PANEL
ALT: 22 U/L (ref 0–42)
AST: 21 U/L (ref 0–46)
Albumin/Globulin Ratio: 1.6 (ref 1.00–2.70)
Albumin: 4.3 g/dL (ref 3.5–5.2)
Alk Phosphatase: 60 U/L (ref 35–117)
Anion Gap: 11 mmol/L (ref 2–17)
BUN: 12 mg/dL (ref 6–20)
CO2: 23 mmol/L (ref 22–29)
Calcium: 9.1 mg/dL (ref 8.5–10.7)
Chloride: 105 mmol/L (ref 98–107)
Creatinine: 0.7 mg/dL (ref 0.5–1.0)
Est, Glom Filt Rate: 117 mL/min/{1.73_m2} (ref >=60)
Globulin: 2.7 g/dL (ref 1.9–4.4)
Glucose: 92 mg/dL (ref 70–99)
Osmolaliy Calculated: 277 mosm/kg (ref 270–287)
Potassium: 4.3 mmol/L (ref 3.5–5.3)
Sodium: 139 mmol/L (ref 135–145)
Total Bilirubin: 0.57 mg/dL (ref 0.00–1.20)
Total Protein: 7 g/dL (ref 5.7–8.3)

## 2024-07-06 LAB — CBC
Hematocrit: 40 % (ref 34.0–47.0)
Hemoglobin: 13.6 g/dL (ref 11.5–15.7)
MCH: 29.1 pg (ref 27.0–34.5)
MCHC: 34 g/dL (ref 30.0–36.0)
MCV: 85.5 fL (ref 81.0–99.0)
MPV: 11.2 fL (ref 7.0–12.2)
NRBC Absolute: 0 10*3/uL (ref 0.00–0.01)
NRBC Automated: 0 % (ref 0.0–0.2)
Platelets: 275 10*3/uL (ref 140–440)
RBC: 4.68 x10e6/mcL (ref 3.60–5.20)
RDW: 13.9 % (ref 10.0–17.0)
WBC: 6.5 10*3/uL (ref 3.8–10.6)

## 2024-07-06 LAB — HEMOGLOBIN A1C
Estimated Avg Glucose: 105
Estimated Avg Glucose: 111
Hemoglobin A1C: 5.3 % (ref 4.0–6.0)

## 2024-07-06 LAB — TSH REFLEX TO FT4: TSH: 1.16 u[IU]/mL (ref 0.358–3.740)

## 2024-07-08 ENCOUNTER — Ambulatory Visit: Admit: 2024-07-08 | Discharge: 2024-07-08 | Attending: Family Medicine | Primary: Family Medicine

## 2024-07-08 MED ORDER — EPINEPHRINE 0.3 MG/0.3ML IJ SOAJ
0.3 | Freq: Once | INTRAMUSCULAR | 1 refills | 30.00000 days | Status: AC
Start: 2024-07-08 — End: 2024-07-08
